# Patient Record
Sex: Female | Born: 2000 | Race: White | Hispanic: No | Marital: Single
Health system: Southern US, Community
[De-identification: ages and names within clinical notes are randomized; demographics above are authoritative.]

## PROBLEM LIST (undated history)

## (undated) ENCOUNTER — Emergency Department (HOSPITAL_COMMUNITY): Payer: BLUE CROSS/BLUE SHIELD | Source: Home / Self Care

---
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
## 2020-01-19 IMAGING — CR DG RIBS W/ CHEST 3+V*L*
3 series · 3 of 3 positions shown · non-contrast
Comparison: None.

CLINICAL DATA: Softball injury.  Left rib pain.

EXAM:
LEFT RIBS AND CHEST - 3+ VIEW

[chest pa]
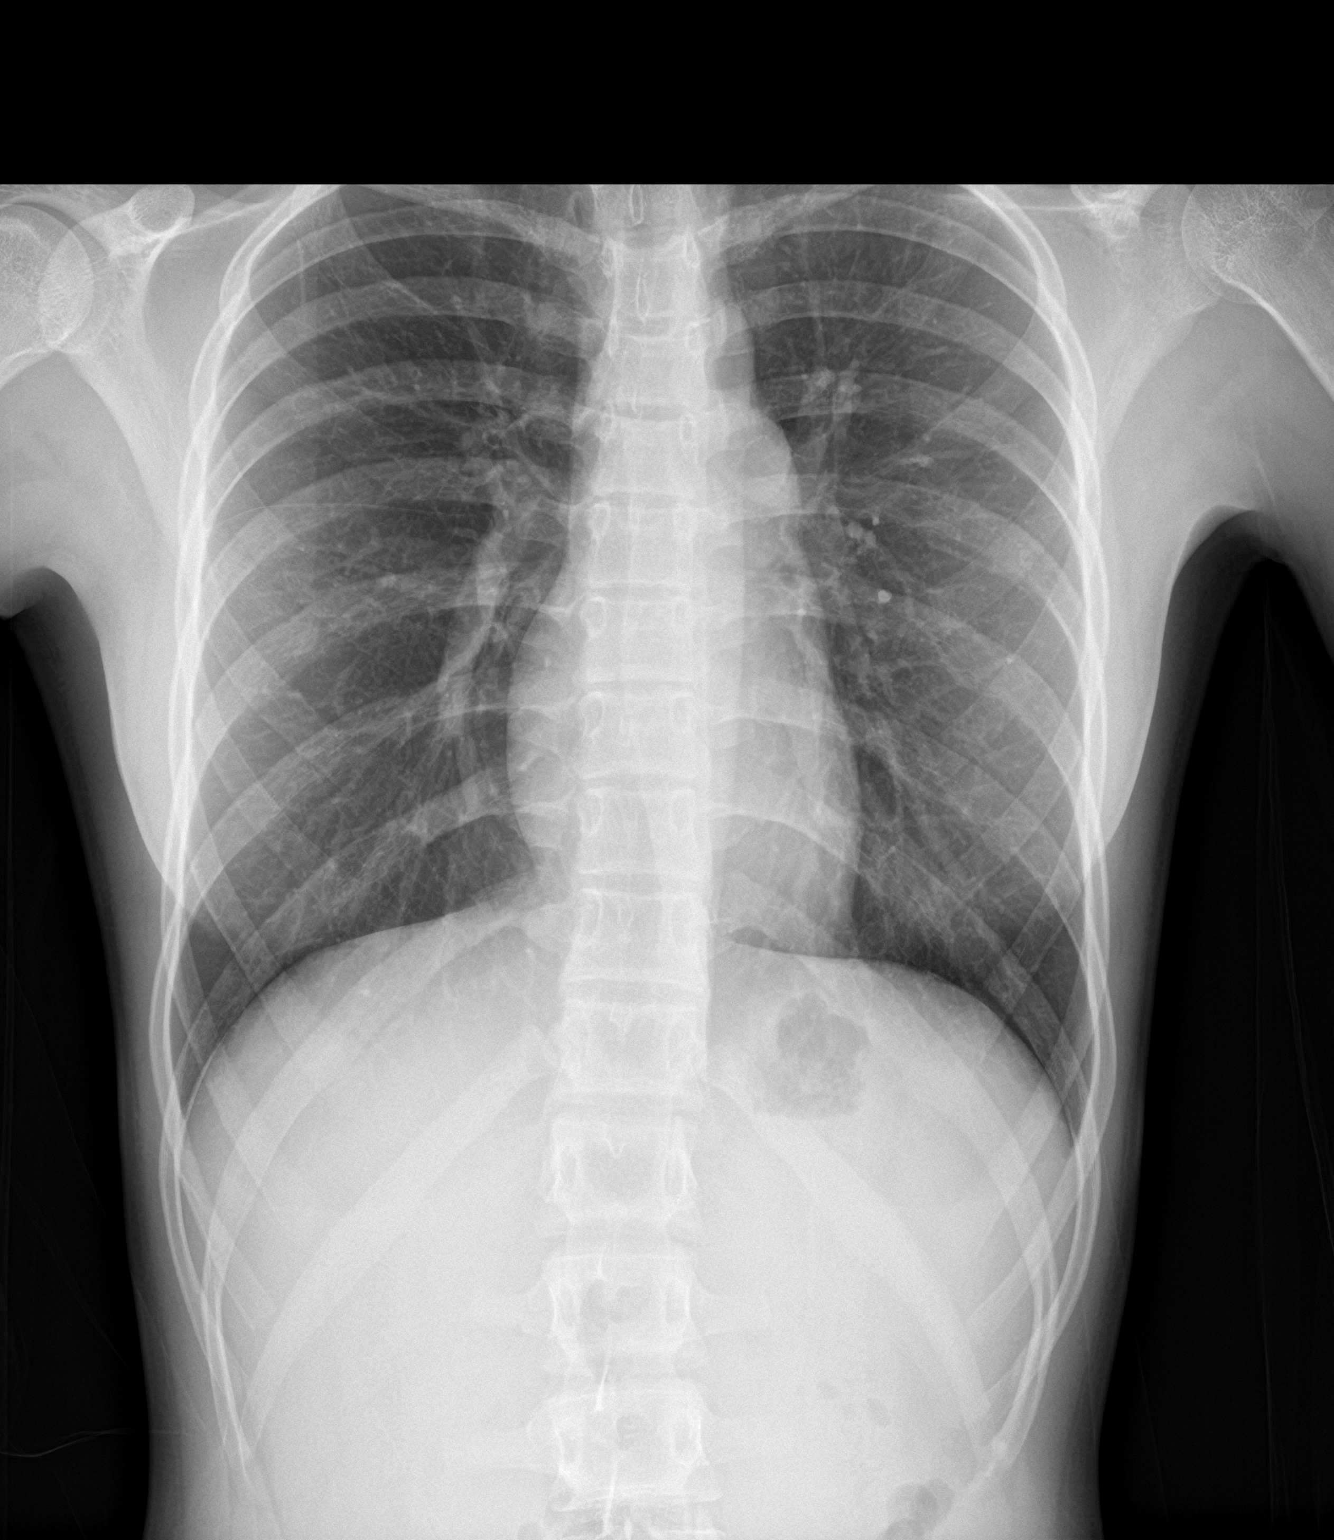

[rib pa obl]
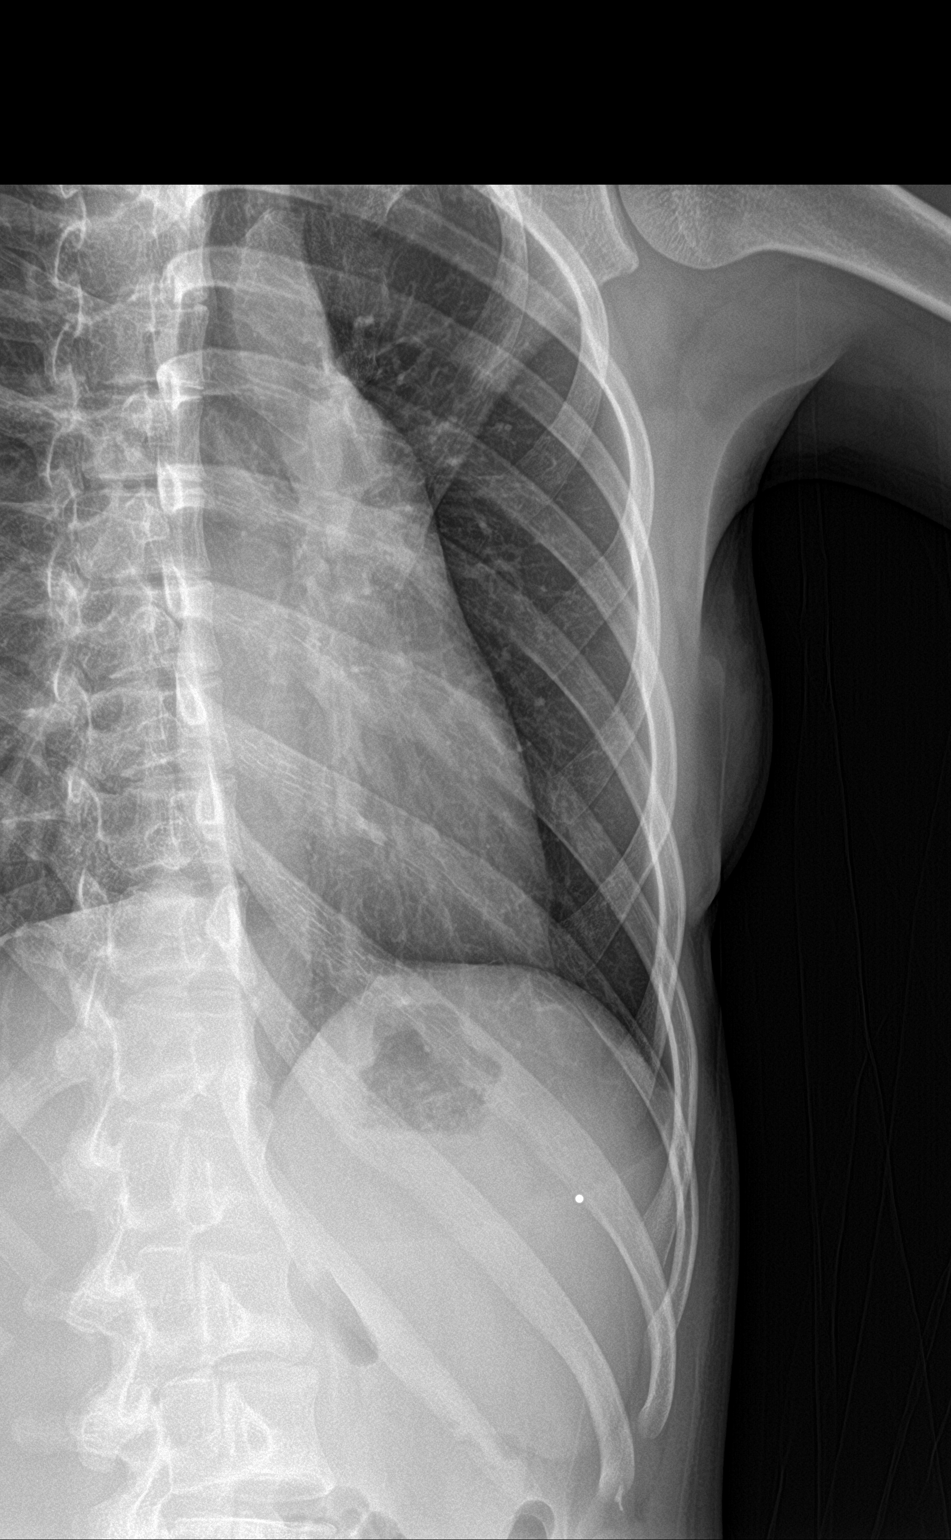

[rib pa]
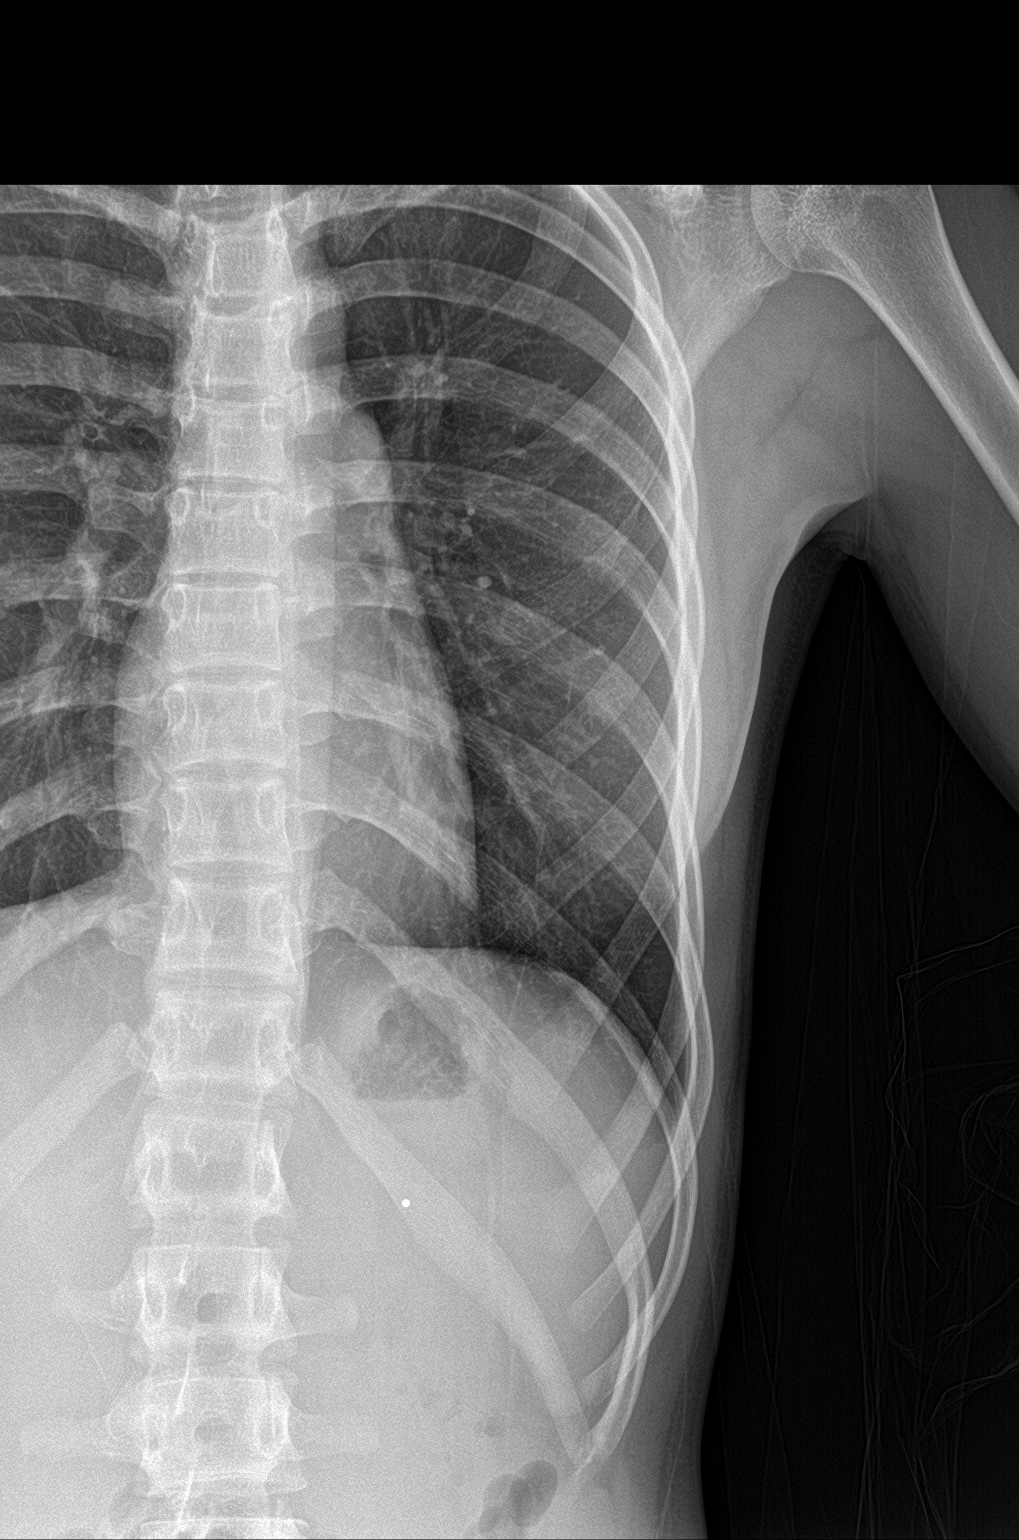

[3 of 3 positions shown; findings below may reference images not displayed]

FINDINGS: No fracture or other bone lesions are seen involving the ribs. There
is no evidence of pneumothorax or pleural effusion. Both lungs are
clear. Heart size and mediastinal contours are within normal limits.
IMPRESSION: Negative.
